# Patient Record
Sex: Female | Born: 1980 | Race: White | Hispanic: No | Marital: Single | Smoking: Never smoker
Health system: Southern US, Community
[De-identification: ages and names within clinical notes are randomized; demographics above are authoritative.]

## PROBLEM LIST (undated history)

## (undated) DIAGNOSIS — I10 Essential (primary) hypertension: Secondary | ICD-10-CM

---

## 2017-03-22 ENCOUNTER — Other Ambulatory Visit: Payer: Self-pay | Admitting: Student

## 2017-03-22 DIAGNOSIS — M5412 Radiculopathy, cervical region: Secondary | ICD-10-CM

## 2017-03-28 ENCOUNTER — Encounter: Payer: Self-pay | Admitting: Radiology

## 2017-03-28 ENCOUNTER — Ambulatory Visit
Admission: RE | Admit: 2017-03-28 | Discharge: 2017-03-28 | Disposition: A | Discharge status: Home / Self Care | Payer: BLUE CROSS/BLUE SHIELD | Source: Ambulatory Visit | Attending: Student | Admitting: Student

## 2017-03-28 DIAGNOSIS — M5412 Radiculopathy, cervical region: Secondary | ICD-10-CM | POA: Diagnosis present

## 2018-04-07 ENCOUNTER — Encounter: Payer: Self-pay | Admitting: Emergency Medicine

## 2018-04-07 DIAGNOSIS — R112 Nausea with vomiting, unspecified: Secondary | ICD-10-CM | POA: Insufficient documentation

## 2018-04-07 DIAGNOSIS — R1011 Right upper quadrant pain: Secondary | ICD-10-CM | POA: Diagnosis not present

## 2018-04-07 LAB — URINALYSIS, COMPLETE (UACMP) WITH MICROSCOPIC
Bilirubin Urine: NEGATIVE
GLUCOSE, UA: NEGATIVE mg/dL
KETONES UR: NEGATIVE mg/dL
LEUKOCYTES UA: NEGATIVE
Nitrite: NEGATIVE
PH: 6 (ref 5.0–8.0)
PROTEIN: NEGATIVE mg/dL
Specific Gravity, Urine: 1.01 (ref 1.005–1.030)

## 2018-04-07 LAB — CBC
HEMATOCRIT: 39.9 % (ref 35.0–47.0)
Hemoglobin: 13.9 g/dL (ref 12.0–16.0)
MCH: 30.4 pg (ref 26.0–34.0)
MCHC: 34.8 g/dL (ref 32.0–36.0)
MCV: 87.3 fL (ref 80.0–100.0)
PLATELETS: 236 10*3/uL (ref 150–440)
RBC: 4.57 MIL/uL (ref 3.80–5.20)
RDW: 12.6 % (ref 11.5–14.5)
WBC: 13.9 10*3/uL — AB (ref 3.6–11.0)

## 2018-04-07 LAB — COMPREHENSIVE METABOLIC PANEL
ALT: 20 U/L (ref 0–44)
AST: 22 U/L (ref 15–41)
Albumin: 4.3 g/dL (ref 3.5–5.0)
Alkaline Phosphatase: 88 U/L (ref 38–126)
Anion gap: 10 (ref 5–15)
BUN: 10 mg/dL (ref 6–20)
CHLORIDE: 100 mmol/L (ref 98–111)
CO2: 25 mmol/L (ref 22–32)
CREATININE: 0.87 mg/dL (ref 0.44–1.00)
Calcium: 9.4 mg/dL (ref 8.9–10.3)
GFR calc Af Amer: >60 mL/min (ref >60)
Glucose, Bld: 112 mg/dL — ABNORMAL HIGH (ref 70–99)
POTASSIUM: 3.9 mmol/L (ref 3.5–5.1)
Sodium: 135 mmol/L (ref 135–145)
Total Bilirubin: 0.6 mg/dL (ref 0.3–1.2)
Total Protein: 7.7 g/dL (ref 6.5–8.1)

## 2018-04-07 LAB — POCT PREGNANCY, URINE: Preg Test, Ur: NEGATIVE

## 2018-04-07 LAB — LIPASE, BLOOD: LIPASE: 34 U/L (ref 11–51)

## 2018-04-07 NOTE — ED Triage Notes (Signed)
Patient states that about 02:00 this morning that she started having intermittent sharpe pain to upper abdomen and vomiting. Patient states that she has taken OTC medications with no improvement.

## 2018-04-08 ENCOUNTER — Emergency Department: Payer: BLUE CROSS/BLUE SHIELD

## 2018-04-08 ENCOUNTER — Encounter: Payer: Self-pay | Admitting: Radiology

## 2018-04-08 ENCOUNTER — Emergency Department
Admission: EM | Admit: 2018-04-08 | Discharge: 2018-04-08 | Disposition: A | Discharge status: Home / Self Care | Payer: BLUE CROSS/BLUE SHIELD | Attending: Emergency Medicine | Admitting: Emergency Medicine

## 2018-04-08 DIAGNOSIS — R1110 Vomiting, unspecified: Secondary | ICD-10-CM

## 2018-04-08 DIAGNOSIS — R1011 Right upper quadrant pain: Secondary | ICD-10-CM

## 2018-04-08 HISTORY — DX: Essential (primary) hypertension: I10

## 2018-04-08 MED ORDER — TRAMADOL HCL 50 MG PO TABS: 50.0000 mg | ORAL_TABLET | Freq: Four times a day (QID) | ORAL | 0 refills | Status: AC | PRN

## 2018-04-08 MED ORDER — ONDANSETRON HCL 4 MG/2ML IJ SOLN
4.0000 mg | Freq: Once | INTRAMUSCULAR | Status: AC
  Administered 2018-04-08: 4 mg via INTRAVENOUS
  Filled 2018-04-08: qty 2

## 2018-04-08 MED ORDER — ONDANSETRON 4 MG PO TBDP: 4.0000 mg | ORAL_TABLET | Freq: Three times a day (TID) | ORAL | 0 refills | Status: AC | PRN

## 2018-04-08 MED ORDER — IOHEXOL 350 MG/ML SOLN
75.0000 mL | Freq: Once | INTRAVENOUS | Status: AC | PRN
  Administered 2018-04-08: 75 mL via INTRAVENOUS

## 2018-04-08 MED ORDER — MORPHINE SULFATE (PF) 2 MG/ML IV SOLN
2.0000 mg | Freq: Once | INTRAVENOUS | Status: AC
  Administered 2018-04-08: 2 mg via INTRAVENOUS
  Filled 2018-04-08: qty 1

## 2018-04-08 NOTE — ED Provider Notes (Signed)
[facility] Emergency Department Provider Note   First MD Initiated Contact with Patient 04/08/18 0036     (approximate)  I have reviewed the triage vital signs and the nursing notes.   HISTORY  Chief Complaint Emesis and Abdominal Pain    HPI Natasha Herrera is a 37 y.o. female presents with a 5328-month history of intermittent upper abdominal pain however for since 2:00 AM yesterday the patient states the pain is been persistent and accompanied by nausea and vomiting.  Patient states current pain score is 8 out of 10.  Patient denies any fever no urinary symptoms.  Patient denies any diarrhea.  Patient does admit to familial history of gallstones (mother).   Past medical history None There are no active problems to display for this patient.   History reviewed. No pertinent surgical history.  Prior to Admission medications   Medication Sig Start Date End Date Taking? Authorizing Provider  ondansetron (ZOFRAN ODT) 4 MG disintegrating tablet Take 1 tablet (4 mg total) by mouth every 8 (eight) hours as needed for nausea or vomiting. 04/08/18   Darci CurrentBrown, [place] N, MD  traMADol (ULTRAM) 50 MG tablet Take 1 tablet (50 mg total) by mouth every 6 (six) hours as needed. 04/08/18 04/08/19  Darci CurrentBrown, [place] N, MD    Allergies No known drug allergies No family history on file.  Social History Social History   Tobacco Use  . Smoking status: Never Smoker  . Smokeless tobacco: Never Used  Substance Use Topics  . Alcohol use: Yes    Comment: occ  . Drug use: Not on file    Review of Systems Constitutional: No fever/chills Eyes: No visual changes. ENT: No sore throat. Cardiovascular: Denies chest pain. Respiratory: Denies shortness of breath. Gastrointestinal: Positive for abdominal pain nausea and vomiting.  No diarrhea.  No constipation. Genitourinary: Negative for dysuria. Musculoskeletal: Negative for neck pain.  Negative for back pain. Integumentary:  Negative for rash. Neurological: Negative for headaches, focal weakness or numbness.  ____________________________________________   PHYSICAL EXAM:  VITAL SIGNS: ED Triage Vitals  Enc Vitals Group     BP 04/07/18 2156 (!) 167/96     Pulse Rate 04/07/18 2156 76     Resp 04/07/18 2156 18     Temp 04/07/18 2157 (!) 97.5 F (36.4 C)     Temp Source 04/07/18 2157 Oral     SpO2 04/07/18 2156 100 %     Weight 04/07/18 2156 70.3 kg (155 lb)     Height 04/07/18 2156 1.575 m (5\' 2" )     Head Circumference --      Peak Flow --      Pain Score 04/07/18 2156 7     Pain Loc --      Pain Edu? --      Excl. in GC? --     Constitutional: Alert and oriented. Well appearing and in no acute distress. Eyes: Conjunctivae are normal.  Head: Atraumatic. Mouth/Throat: Mucous membranes are moist.  Oropharynx non-erythematous. Neck: No stridor.  Cardiovascular: Normal rate, regular rhythm. Good peripheral circulation. Grossly normal heart sounds. Respiratory: Normal respiratory effort.  No retractions. Lungs CTAB. Gastrointestinal: Right upper quadrant tenderness to palpation.. No distention.  Musculoskeletal: No lower extremity tenderness nor edema. No gross deformities of extremities. Neurologic:  Normal speech and language. No gross focal neurologic deficits are appreciated.  Skin:  Skin is warm, dry and intact. No rash noted. Psychiatric: Mood and affect are normal. Speech and behavior are normal.  ____________________________________________   LABS (all labs ordered are listed, but only abnormal results are displayed)  Labs Reviewed  COMPREHENSIVE METABOLIC PANEL - Abnormal; Notable for the following components:      Result Value   Glucose, Bld 112 (*)    All other components within normal limits  CBC - Abnormal; Notable for the following components:   WBC 13.9 (*)    All other components within normal limits  URINALYSIS, COMPLETE (UACMP) WITH MICROSCOPIC - Abnormal; Notable for the  following components:   Color, Urine STRAW (*)    APPearance CLEAR (*)    Hgb urine dipstick MODERATE (*)    Bacteria, UA RARE (*)    All other components within normal limits  LIPASE, BLOOD  POC URINE PREG, ED  POCT PREGNANCY, URINE   ____________________________________________  EKG  ED ECG REPORT I, [place] N Daylan Juhnke, the attending physician, personally viewed and interpreted this ECG.   Date: 04/08/2018  EKG Time: 10:02 PM  Rate: 78  Rhythm: Normal sinus rhythm  Axis: Normal  Intervals: Normal  ST&T Change: None  ____________________________________________  RADIOLOGY I, [place] N Eathon Valade, personally viewed and evaluated these images (plain radiographs) as part of my medical decision making, as well as reviewing the written report by the radiologist.  ED MD interpretation: Contracted gallbladder with increase wall thickness per radiologist  Official radiology report(s): Ct Abdomen Pelvis W Contrast  Result Date: 04/08/2018 CLINICAL DATA:  Abdominal pain to the upper abdomen with vomiting EXAM: CT ABDOMEN AND PELVIS WITH CONTRAST TECHNIQUE: Multidetector CT imaging of the abdomen and pelvis was performed using the standard protocol following bolus administration of intravenous contrast. CONTRAST:  75mL OMNIPAQUE IOHEXOL 350 MG/ML SOLN COMPARISON:  Ultrasound 04/08/2018 FINDINGS: Lower chest: No acute abnormality. Hepatobiliary: No focal liver abnormality is seen. No gallstones, gallbladder wall thickening, or biliary dilatation. Pancreas: Unremarkable. No pancreatic ductal dilatation or surrounding inflammatory changes. Spleen: Normal in size without focal abnormality. Adrenals/Urinary Tract: Adrenal glands are unremarkable. Kidneys are normal, without renal calculi, focal lesion, or hydronephrosis. Bladder is unremarkable. Stomach/Bowel: Stomach is within normal limits. Appendix appears normal. No evidence of bowel wall thickening, distention, or inflammatory changes.  Vascular/Lymphatic: No significant vascular findings are present. No enlarged abdominal or pelvic lymph nodes. Reproductive: Uterus and bilateral adnexa are unremarkable. Other: Small amount of free fluid in the pelvis.  No free air. Musculoskeletal: No acute or significant osseous findings. IMPRESSION: 1. No CT evidence for acute intra-abdominal or pelvic abnormality. 2. Small amount of free fluid in the pelvis Electronically Signed   By: Jasmine Pang M.D.   On: 04/08/2018 02:45   US Abdomen Limited Ruq  Result Date: 04/08/2018 CLINICAL DATA:  Right upper quadrant pain with vomiting EXAM: ULTRASOUND ABDOMEN LIMITED RIGHT UPPER QUADRANT COMPARISON:  None. FINDINGS: Gallbladder: No shadowing stones. Negative sonographic Murphy. Contracted gallbladder. Slight increased wall thickness up to 4 mm. Common bile duct: Diameter: 3 mm Liver: No focal lesion identified. Within normal limits in parenchymal echogenicity. Portal vein is patent on color Doppler imaging with normal direction of blood flow towards the liver. IMPRESSION: Contracted gallbladder with slight increased wall thickness, suspected to be secondary to contracted state. No shadowing stones or biliary dilatation. Electronically Signed   By: Jasmine Pang M.D.   On: 04/08/2018 01:52      Procedures   ____________________________________________   INITIAL IMPRESSION / ASSESSMENT AND PLAN / ED COURSE  As part of my medical decision making, I reviewed the following data within the electronic MEDICAL RECORD NUMBER  37 year old female presenting with above-stated history and physical exam secondary to upper abdominal pain with nausea and vomiting.  Concern for possible cholelithiasis versus cholecystitis and as such ultrasound was performed which revealed no evidence of cholelithiasis however gallbladder was contracted during the exam.  CT scan of the abdomen was performed which revealed no acute intra-abdominal pathology.  No clear etiology  identified for the patient's abdominal pain although gallbladder disease versus gastric ulcer both plausible diagnoses.  Patient will be referred to gastroenterology for further outpatient evaluation and management    ____________________________________________  FINAL CLINICAL IMPRESSION(S) / ED DIAGNOSES  Final diagnoses:  Vomiting  RUQ pain  Right upper quadrant abdominal pain     MEDICATIONS GIVEN DURING THIS VISIT:  Medications  morphine 2 MG/ML injection 2 mg (2 mg Intravenous Given 04/08/18 0054)  ondansetron (ZOFRAN) injection 4 mg (4 mg Intravenous Given 04/08/18 0054)  iohexol (OMNIPAQUE) 350 MG/ML injection 75 mL (75 mLs Intravenous Contrast Given 04/08/18 0231)     ED Discharge Orders        Ordered    ondansetron (ZOFRAN ODT) 4 MG disintegrating tablet  Every 8 hours PRN     04/08/18 0341    traMADol (ULTRAM) 50 MG tablet  Every 6 hours PRN     04/08/18 0341       Note:  This document was prepared using Dragon voice recognition software and may include unintentional dictation errors.    Darci Current, MD 04/08/18 [phone]

## 2018-04-15 ENCOUNTER — Other Ambulatory Visit: Payer: Self-pay | Admitting: Gastroenterology

## 2018-04-15 DIAGNOSIS — R1013 Epigastric pain: Secondary | ICD-10-CM

## 2018-04-15 DIAGNOSIS — R1011 Right upper quadrant pain: Secondary | ICD-10-CM

## 2018-04-15 DIAGNOSIS — R110 Nausea: Secondary | ICD-10-CM

## 2019-08-19 ENCOUNTER — Other Ambulatory Visit: Payer: Self-pay

## 2019-08-19 DIAGNOSIS — Z20822 Contact with and (suspected) exposure to covid-19: Secondary | ICD-10-CM

## 2019-08-21 LAB — NOVEL CORONAVIRUS, NAA: SARS-CoV-2, NAA: NOT DETECTED

## 2019-10-08 ENCOUNTER — Ambulatory Visit: Payer: BC Managed Care – PPO | Attending: Internal Medicine

## 2019-10-08 DIAGNOSIS — Z20822 Contact with and (suspected) exposure to covid-19: Secondary | ICD-10-CM

## 2019-10-09 ENCOUNTER — Encounter: Payer: Self-pay | Admitting: *Deleted

## 2019-10-09 LAB — NOVEL CORONAVIRUS, NAA: SARS-CoV-2, NAA: DETECTED — AB

## 2020-02-25 ENCOUNTER — Other Ambulatory Visit: Payer: Self-pay | Admitting: Obstetrics and Gynecology

## 2020-02-25 DIAGNOSIS — Z01419 Encounter for gynecological examination (general) (routine) without abnormal findings: Secondary | ICD-10-CM

## 2020-02-25 DIAGNOSIS — N6311 Unspecified lump in the right breast, upper outer quadrant: Secondary | ICD-10-CM

## 2020-02-25 DIAGNOSIS — N6315 Unspecified lump in the right breast, overlapping quadrants: Secondary | ICD-10-CM

## 2020-03-02 ENCOUNTER — Ambulatory Visit
Admission: RE | Admit: 2020-03-02 | Discharge: 2020-03-02 | Disposition: A | Discharge status: Home / Self Care | Payer: BC Managed Care – PPO | Source: Ambulatory Visit | Attending: Obstetrics and Gynecology | Admitting: Obstetrics and Gynecology

## 2020-03-02 ENCOUNTER — Ambulatory Visit: Payer: BC Managed Care – PPO

## 2020-03-02 DIAGNOSIS — Z01419 Encounter for gynecological examination (general) (routine) without abnormal findings: Secondary | ICD-10-CM

## 2020-03-02 DIAGNOSIS — N6315 Unspecified lump in the right breast, overlapping quadrants: Secondary | ICD-10-CM

## 2020-03-02 DIAGNOSIS — N6311 Unspecified lump in the right breast, upper outer quadrant: Secondary | ICD-10-CM | POA: Diagnosis present

## 2021-11-19 IMAGING — US US BREAST*R* LIMITED INC AXILLA
1 series · 2 of 2 positions shown · non-contrast
Comparison: None

CLINICAL DATA: 38-year-old patient with recent intermittent lump an
intermittent pain and burning in the 9-10 o'clock region of the
right breast. She does not palpate the lump today and she does not
have pain today.

EXAM:
DIGITAL DIAGNOSTIC BILATERAL MAMMOGRAM WITH CAD AND TOMO
ULTRASOUND RIGHT BREAST

[Series 1: us breast*right* limited inc axilla · 0.07mm/px · 2 of 2 slices shown]
[im 1/2]
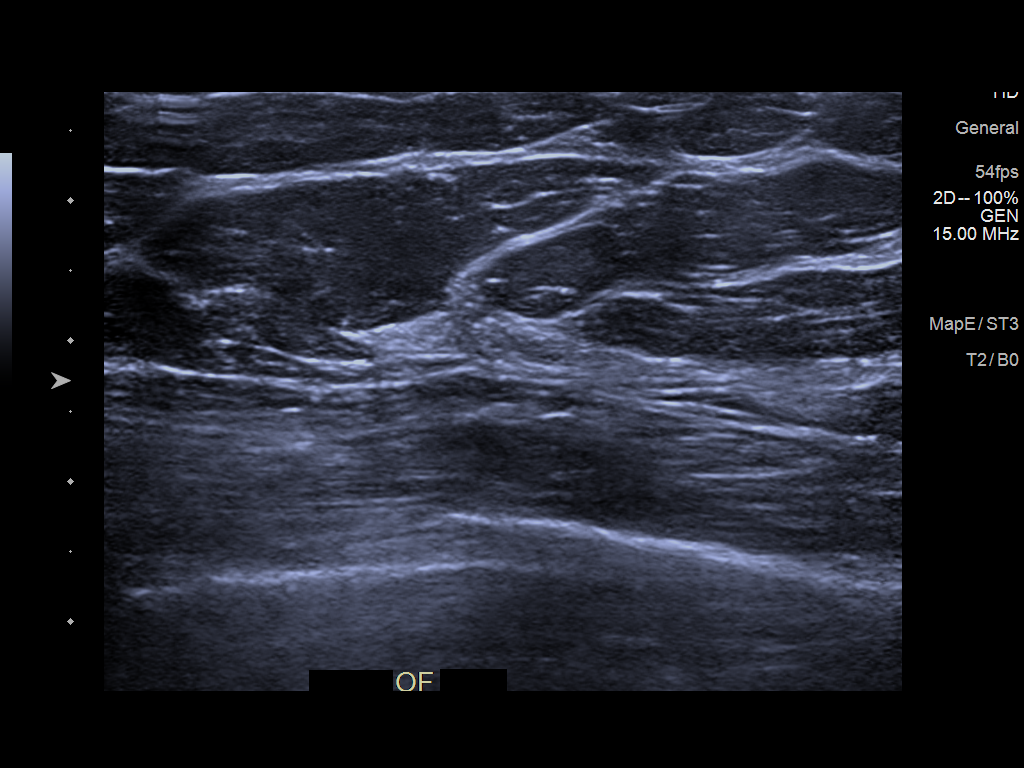
[im 2/2]
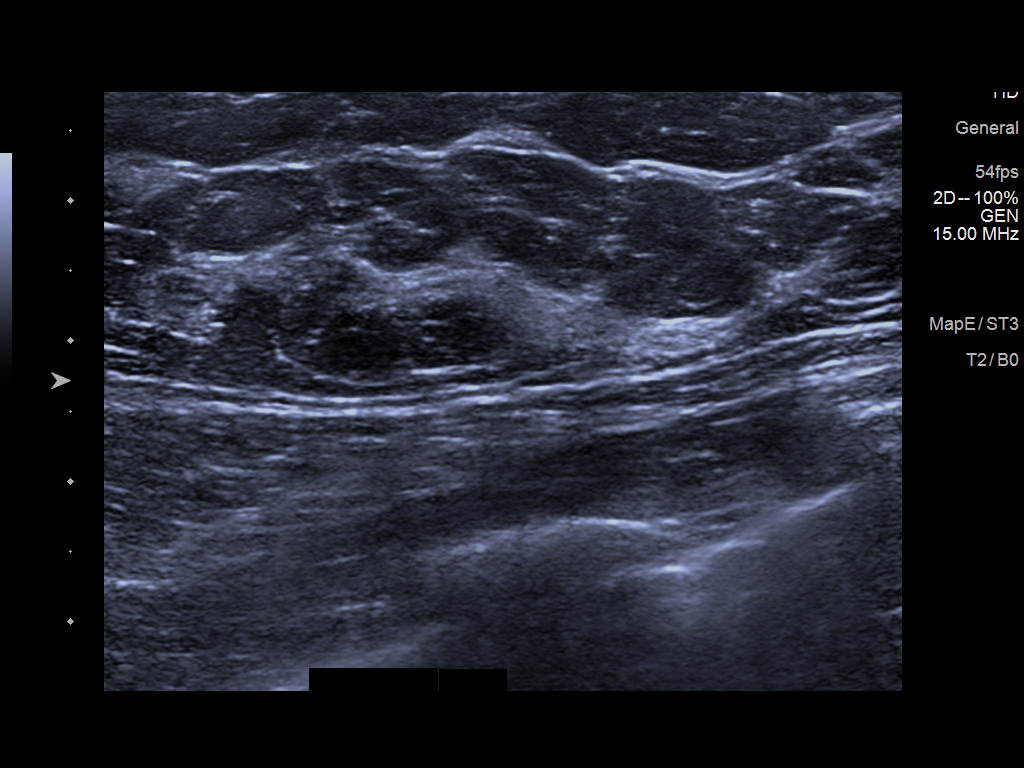

[2 of 2 positions shown; findings below may reference images not displayed]

ACR Breast Density Category b: There are scattered areas of
fibroglandular density.
FINDINGS: Metallic skin marker placed in the region of recent pain/lump. Spot
tangential view of this region of the outer right breast shows no
suspicious findings.

No mass, architectural distortion, or suspicious microcalcification
is identified to suggest malignancy in either breast.

Mammographic images were processed with CAD.

Targeted ultrasound is performed, showing normal fibroglandular
tissue in the region of recent pain and intermittent lumpiness in
the 9 o'clock axis of the right breast. No solid or cystic mass or
abnormal shadowing is identified to suggest malignancy.
IMPRESSION: No evidence of malignancy in either breast. Normal breast parenchyma
in the region of the patient's recent symptoms.

RECOMMENDATION:
Screening mammogram at age 40 unless there are persistent or
intervening clinical concerns. (Code:J6-0-PVB)

I have discussed the findings and recommendations with the patient.
If applicable, a reminder letter will be sent to the patient
regarding the next appointment.

BI-RADS CATEGORY  1: Negative.

## 2021-11-19 IMAGING — MG DIGITAL DIAGNOSTIC BILAT W/ TOMO W/ CAD
6 of 12 series · 6 of 36 positions shown · non-contrast
Comparison: None

CLINICAL DATA: 38-year-old patient with recent intermittent lump an
intermittent pain and burning in the 9-10 o'clock region of the
right breast. She does not palpate the lump today and she does not
have pain today.

EXAM:
DIGITAL DIAGNOSTIC BILATERAL MAMMOGRAM WITH CAD AND TOMO
ULTRASOUND RIGHT BREAST

[L CC synth-2D]
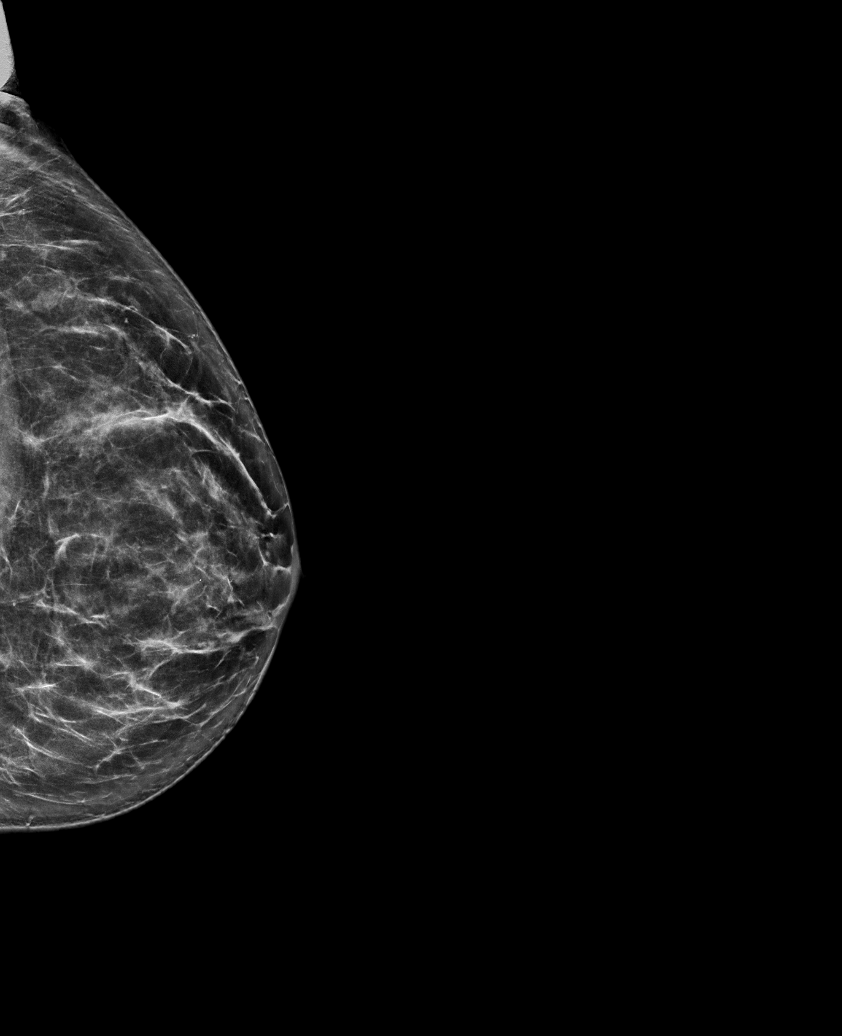

[R TAN synth-2D]
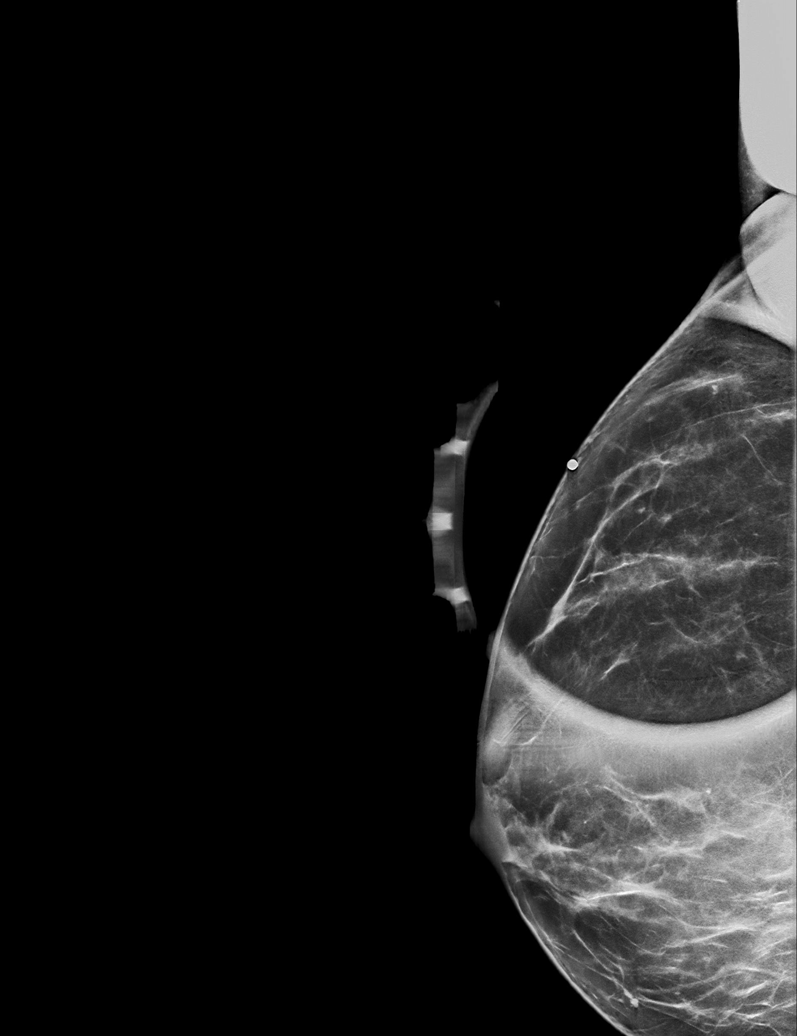

[R XCCL synth-2D]
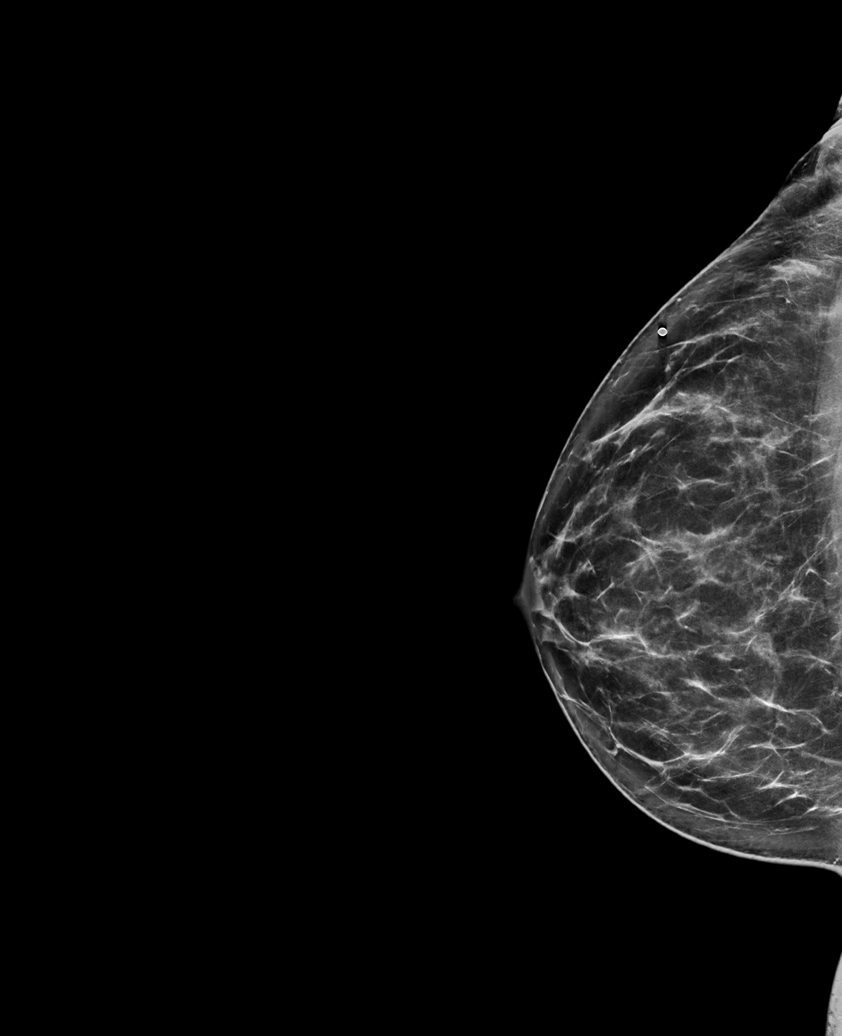

[L MLO synth-2D]
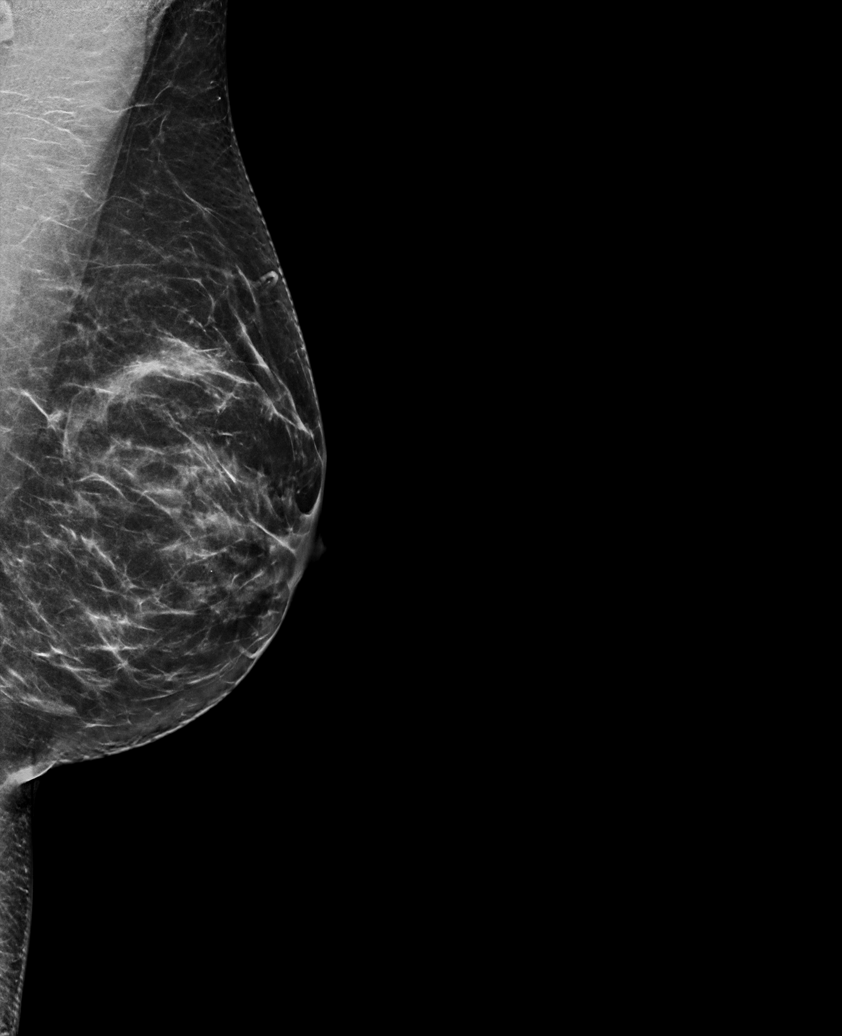

[R CC synth-2D]
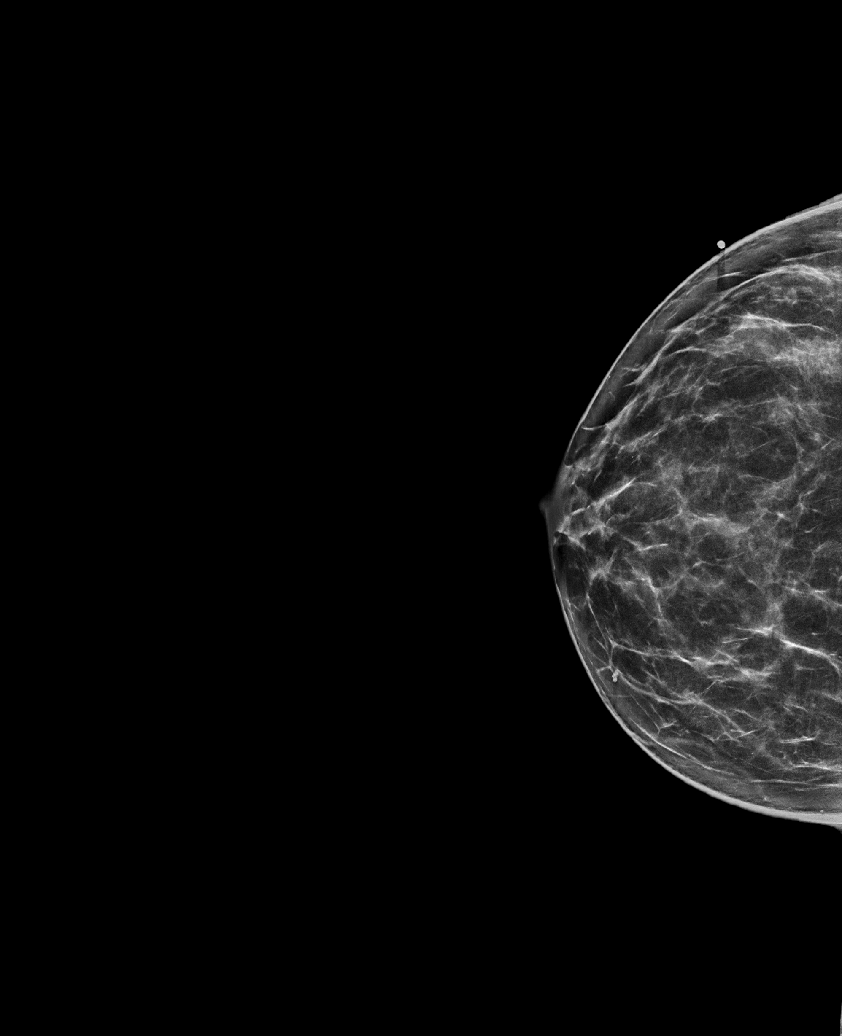

[R MLO synth-2D]
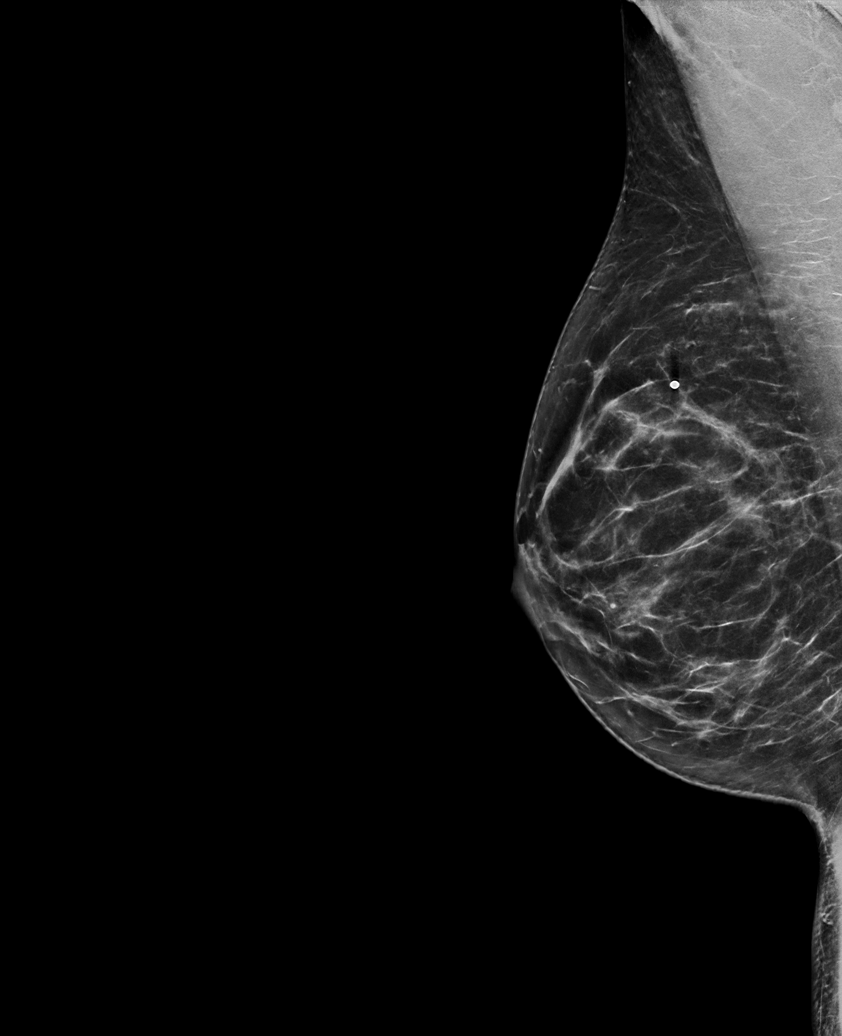

[6 of 36 positions shown; findings below may reference images not displayed]

ACR Breast Density Category b: There are scattered areas of
fibroglandular density.
FINDINGS: Metallic skin marker placed in the region of recent pain/lump. Spot
tangential view of this region of the outer right breast shows no
suspicious findings.

No mass, architectural distortion, or suspicious microcalcification
is identified to suggest malignancy in either breast.

Mammographic images were processed with CAD.

Targeted ultrasound is performed, showing normal fibroglandular
tissue in the region of recent pain and intermittent lumpiness in
the 9 o'clock axis of the right breast. No solid or cystic mass or
abnormal shadowing is identified to suggest malignancy.
IMPRESSION: No evidence of malignancy in either breast. Normal breast parenchyma
in the region of the patient's recent symptoms.

RECOMMENDATION:
Screening mammogram at age 40 unless there are persistent or
intervening clinical concerns. (Code:J6-0-PVB)

I have discussed the findings and recommendations with the patient.
If applicable, a reminder letter will be sent to the patient
regarding the next appointment.

BI-RADS CATEGORY  1: Negative.
